# Patient Record
Sex: Male | Born: 1960 | Race: Asian | Hispanic: Yes | Marital: Married | Smoking: Never smoker
Health system: Southern US, Community
[De-identification: ages and names within clinical notes are randomized; demographics above are authoritative.]

---

## 2017-10-11 ENCOUNTER — Emergency Department (HOSPITAL_COMMUNITY): Payer: Self-pay

## 2017-10-11 ENCOUNTER — Encounter (HOSPITAL_COMMUNITY): Payer: Self-pay

## 2017-10-11 ENCOUNTER — Other Ambulatory Visit: Payer: Self-pay

## 2017-10-11 ENCOUNTER — Ambulatory Visit (HOSPITAL_COMMUNITY)
Admission: EM | Admit: 2017-10-11 | Discharge: 2017-10-11 | Disposition: A | Discharge status: Other Acute Inpatient Hospital | Payer: Self-pay

## 2017-10-11 ENCOUNTER — Emergency Department (HOSPITAL_COMMUNITY)
Admission: EM | Admit: 2017-10-11 | Discharge: 2017-10-11 | Disposition: A | Discharge status: Home / Self Care | Payer: Self-pay | Attending: Emergency Medicine | Admitting: Emergency Medicine

## 2017-10-11 DIAGNOSIS — S066X0A Traumatic subarachnoid hemorrhage without loss of consciousness, initial encounter: Secondary | ICD-10-CM | POA: Insufficient documentation

## 2017-10-11 DIAGNOSIS — I609 Nontraumatic subarachnoid hemorrhage, unspecified: Secondary | ICD-10-CM

## 2017-10-11 DIAGNOSIS — M549 Dorsalgia, unspecified: Secondary | ICD-10-CM | POA: Insufficient documentation

## 2017-10-11 DIAGNOSIS — I10 Essential (primary) hypertension: Secondary | ICD-10-CM | POA: Insufficient documentation

## 2017-10-11 DIAGNOSIS — Y9389 Activity, other specified: Secondary | ICD-10-CM | POA: Insufficient documentation

## 2017-10-11 DIAGNOSIS — Y929 Unspecified place or not applicable: Secondary | ICD-10-CM | POA: Insufficient documentation

## 2017-10-11 DIAGNOSIS — Y998 Other external cause status: Secondary | ICD-10-CM | POA: Insufficient documentation

## 2017-10-11 DIAGNOSIS — W1789XA Other fall from one level to another, initial encounter: Secondary | ICD-10-CM | POA: Insufficient documentation

## 2017-10-11 LAB — I-STAT CHEM 8, ED
BUN: 10 mg/dL (ref 6–20)
Calcium, Ion: 1.15 mmol/L (ref 1.15–1.40)
Chloride: 106 mmol/L (ref 101–111)
Creatinine, Ser: 1.1 mg/dL (ref 0.61–1.24)
Glucose, Bld: 114 mg/dL — ABNORMAL HIGH (ref 65–99)
HCT: 44 % (ref 39.0–52.0)
HEMOGLOBIN: 15 g/dL (ref 13.0–17.0)
POTASSIUM: 4 mmol/L (ref 3.5–5.1)
Sodium: 142 mmol/L (ref 135–145)
TCO2: 27 mmol/L (ref 22–32)

## 2017-10-11 LAB — COMPREHENSIVE METABOLIC PANEL
ALBUMIN: 4.4 g/dL (ref 3.5–5.0)
ALT: 26 U/L (ref 17–63)
AST: 31 U/L (ref 15–41)
Alkaline Phosphatase: 114 U/L (ref 38–126)
Anion gap: 11 (ref 5–15)
BUN: 8 mg/dL (ref 6–20)
CHLORIDE: 104 mmol/L (ref 101–111)
CO2: 23 mmol/L (ref 22–32)
CREATININE: 1.11 mg/dL (ref 0.61–1.24)
Calcium: 9.5 mg/dL (ref 8.9–10.3)
GFR calc non Af Amer: >60 mL/min (ref >60)
GLUCOSE: 111 mg/dL — AB (ref 65–99)
Potassium: 3.9 mmol/L (ref 3.5–5.1)
SODIUM: 138 mmol/L (ref 135–145)
Total Bilirubin: 0.3 mg/dL (ref 0.3–1.2)
Total Protein: 8.1 g/dL (ref 6.5–8.1)

## 2017-10-11 LAB — PROTIME-INR
INR: 0.95
Prothrombin Time: 12.6 seconds (ref 11.4–15.2)

## 2017-10-11 LAB — CBC
HCT: 42.8 % (ref 39.0–52.0)
Hemoglobin: 14.8 g/dL (ref 13.0–17.0)
MCH: 30.9 pg (ref 26.0–34.0)
MCHC: 34.6 g/dL (ref 30.0–36.0)
MCV: 89.4 fL (ref 78.0–100.0)
PLATELETS: 263 10*3/uL (ref 150–400)
RBC: 4.79 MIL/uL (ref 4.22–5.81)
RDW: 12.8 % (ref 11.5–15.5)
WBC: 13.7 10*3/uL — ABNORMAL HIGH (ref 4.0–10.5)

## 2017-10-11 LAB — ETHANOL: Alcohol, Ethyl (B): <10 mg/dL (ref <10)

## 2017-10-11 MED ORDER — LABETALOL HCL 5 MG/ML IV SOLN: 20.0000 mg | Freq: Once | INTRAVENOUS | Status: DC

## 2017-10-11 MED ORDER — TRAMADOL HCL 50 MG PO TABS: 50.0000 mg | ORAL_TABLET | Freq: Four times a day (QID) | ORAL | 0 refills | Status: DC | PRN

## 2017-10-11 MED ORDER — LABETALOL HCL 5 MG/ML IV SOLN
20.0000 mg | Freq: Once | INTRAVENOUS | Status: AC
  Administered 2017-10-11: 20 mg via INTRAVENOUS
  Filled 2017-10-11: qty 4

## 2017-10-11 MED ORDER — LISINOPRIL-HYDROCHLOROTHIAZIDE 10-12.5 MG PO TABS: 1.0000 | ORAL_TABLET | Freq: Every day | ORAL | 1 refills | Status: DC

## 2017-10-11 MED ORDER — FENTANYL CITRATE (PF) 100 MCG/2ML IJ SOLN
50.0000 ug | Freq: Once | INTRAMUSCULAR | Status: AC
  Administered 2017-10-11: 50 ug via INTRAVENOUS
  Filled 2017-10-11: qty 2

## 2017-10-11 MED ORDER — ONDANSETRON HCL 4 MG/2ML IJ SOLN
4.0000 mg | Freq: Once | INTRAMUSCULAR | Status: AC
  Administered 2017-10-11: 4 mg via INTRAVENOUS
  Filled 2017-10-11: qty 2

## 2017-10-11 NOTE — ED Provider Notes (Signed)
Patient placed in Quick Look pathway, seen and evaluated   Chief Complaint: fall  HPI:   Spanish speaking only male here for evaluation after fall while at work. Was standing on a 12 ft stilt painting when he fell backwards landing mostly on posterior head. Reports pain and swelling to top of head, right sided neck pain and low back pain since.   ROS: No LOC, vision changes, nausea, vomiting, amnesia to event, seizure like activity, anticoagulant use. No PMH states he has drank 2 cups of coffee, red bull and pepsi PTA.   Physical Exam:   Gen: No distress  Neuro: Awake and Alert  Skin: Warm    Focused Exam: Hypertensive 200/120s. Hematoma to coronal aspect of scalp, mildly tender w/o obvious crepitus or depression. R sided neck tenderness, no midline c-spine tenderness or step offs, pain with R neck bend and rotation. Mild bilateral lumbar paraspinal muscle tenderness, no midline CTL spine tenderness. No chest or abdominal tenderness. No pain with AP/L pelvis compression. Lungs CTAB. Ambulatory without pain. Strength 5/5 with hand grip and ankle F/E.  Sensation to light touch intact in hands and feet. Normal gait. No pronator drift. Normal finger-to-nose and finger tapping. CN I and VIII not tested. CN II-XII grossly intact bilaterally.   I recommended CT head/cervical spine and TL spine x-rays however pt is very concerned about cost of care in ED due to lack of insurance. He is inquiring about payment plans and is hesitant to stay for imaging. Discussed purpose of imaging and risk of leaving AMA. Ultimately agreed to CT head only. Will attempt to consult CM/SW to speak to patient. Initiation of care has begun. The patient has been counseled on the process, plan, and necessity for staying for the completion/evaluation, and the remainder of the medical screening examination    Liberty HandyGibbons, Ziair Penson J, Cordelia Poche-C 10/11/17 1921

## 2017-10-11 NOTE — ED Notes (Signed)
Pt daughter tearful at the idea of her father being admitted. Pt and family appear worried about cost of admission. Will continue to round and keep patient and family up to date on plan of care. This RN explained current POC to pt

## 2017-10-11 NOTE — ED Notes (Signed)
Patient transported to CT 

## 2017-10-11 NOTE — ED Notes (Signed)
EDP aware of BP. 

## 2017-10-11 NOTE — Care Management Note (Signed)
Case Management Note  Patient Details  Name: Joel CoganDimas Borman MRN: [id number] Date of Birth: May 28, 1961  Subjective/Objective:                  fall 6 feet/hit head  [facility] the [facility] and his daughter at the bedside. CM provided his [facility] with [facility] and encouraged her and the patient to call to schedule an appointment at discharge. The patient speaks some [place]. Daughter states he does not have a PCP or insurance. Provided with the telephone number for [employer].   Expected Discharge Date:                  Expected Discharge Plan:  Home/Self Care  In-House Referral:     Discharge planning Services  CM Consult, Indigent [facility] Clinic  Post Acute Care Choice:    Choice offered to:     DME Arranged:    DME Agency:     HH Arranged:    HH Agency:     Status of Service:  Completed, signed off  If discussed at [employer] Length of Stay Meetings, dates discussed:    Additional Comments:  Antony HasteBennett, Edker Punt Harris, RN 10/11/2017, 9:30 PM

## 2017-10-11 NOTE — ED Notes (Signed)
Neuro surgery at bedside.

## 2017-10-11 NOTE — Discharge Instructions (Signed)
Take Tylenol as needed for pain.  You can take the Ultram medication if you need something stronger.  Start taking her blood pressure medication every day.  It is important for you to follow-up with your primary care doctor to check up on your blood pressure

## 2017-10-11 NOTE — ED Notes (Signed)
ED Provider at bedside. 

## 2017-10-11 NOTE — ED Provider Notes (Signed)
[facility] EMERGENCY DEPARTMENT Provider Note   CSN: [id number] Arrival date & time: 10/11/17  1839     History   Chief Complaint Chief Complaint  Patient presents with  . Fall    HPI Joel Howard is a 57 y.o. male.  HPI Patient presented to the emergency room for evaluation of headache after a fall.  Patient works as a Education administrator.  He was on stilts when he fell backwards landing on his head.  He thinks he may have been 12 feet off the ground.  Patient has pain in the posterior part of his head and somewhat in his neck.  He complains of pain to me along his lower ribs but no pain on his spine.  He denies any abdominal pain.  No numbness or weakness.  He denies any loss of consciousness.  No vomiting.  No amnesia.  He does not use anticoagulants.  Patient was noted to be hypertensive in the emergency room. when I asked about his blood pressure the patient states he was told he had high blood pressure in the past he has not been on medications for that for a long period of time.  Denies any trouble with chest pain or shortness of breath. History reviewed. No pertinent past medical history.  There are no active problems to display for this patient.   History reviewed. No pertinent surgical history.     Home Medications    Prior to Admission medications   Medication Sig Start Date End Date Taking? Authorizing Provider  lisinopril-hydrochlorothiazide (PRINZIDE,ZESTORETIC) 10-12.5 MG tablet Take 1 tablet by mouth daily. 10/11/17   Linwood Dibbles, MD  traMADol (ULTRAM) 50 MG tablet Take 1 tablet (50 mg total) by mouth every 6 (six) hours as needed. 10/11/17   Linwood Dibbles, MD    Family History History reviewed. No pertinent family history.  Social History Social History   Tobacco Use  . Smoking status: Never Smoker  . Smokeless tobacco: Never Used  Substance Use Topics  . Alcohol use: No    Frequency: Never  . Drug use: Not on file     Allergies   Patient  has no known allergies.   Review of Systems Review of Systems  All other systems reviewed and are negative.    Physical Exam Updated Vital Signs BP (!) 152/98   Pulse 80   Temp 98.8 F (37.1 C) (Oral)   Resp 10   SpO2 96%   Physical Exam  Constitutional: He appears well-developed and well-nourished. No distress.  HENT:  Head: Normocephalic and atraumatic.  Right Ear: External ear normal.  Left Ear: External ear normal.  Eyes: Conjunctivae are normal. Right eye exhibits no discharge. Left eye exhibits no discharge. No scleral icterus.  Neck: Neck supple. No tracheal deviation present.  Cardiovascular: Normal rate, regular rhythm and intact distal pulses.  Pulmonary/Chest: Effort normal and breath sounds normal. No stridor. No respiratory distress. He has no wheezes. He has no rales.  Abdominal: Soft. Bowel sounds are normal. He exhibits no distension. There is no tenderness. There is no rebound and no guarding.  Musculoskeletal: He exhibits no edema or tenderness.  Neurological: He is alert. He has normal strength. No cranial nerve deficit (no facial droop, extraocular movements intact, no slurred speech) or sensory deficit. He exhibits normal muscle tone. He displays no seizure activity. Coordination normal.  Skin: Skin is warm and dry. No rash noted.  Psychiatric: He has a normal mood and affect.  Nursing note and  vitals reviewed.    ED Treatments / Results  Labs (all labs ordered are listed, but only abnormal results are displayed) Labs Reviewed  COMPREHENSIVE METABOLIC PANEL - Abnormal; Notable for the following components:      Result Value   Glucose, Bld 111 (*)    All other components within normal limits  CBC - Abnormal; Notable for the following components:   WBC 13.7 (*)    All other components within normal limits  I-STAT CHEM 8, ED - Abnormal; Notable for the following components:   Glucose, Bld 114 (*)    All other components within normal limits    ETHANOL  PROTIME-INR  URINALYSIS, ROUTINE W REFLEX MICROSCOPIC    EKG  EKG Interpretation  Date/Time:  Wednesday October 11 2017 21:29:06 EDT Ventricular Rate:  95 PR Interval:    QRS Duration: 101 QT Interval:  359 QTC Calculation: 452 R Axis:   65 Text Interpretation:  Sinus rhythm Borderline prolonged PR interval RSR' in V1 or V2, right VCD or RVH ST elev, probable normal early repol pattern limb lead reversal corrected Confirmed by Linwood Dibbles [phone]) on 10/11/2017 9:31:46 PM       Radiology Dg Chest 2 View  Result Date: 10/11/2017 CLINICAL DATA:  Fall from 12 feet EXAM: CHEST - 2 VIEW COMPARISON:  None. FINDINGS: The heart size and mediastinal contours are within normal limits. Both lungs are clear. The visualized skeletal structures are unremarkable. IMPRESSION: No active cardiopulmonary disease or rib fracture. Electronically Signed   By: Deatra Robinson M.D.   On: 10/11/2017 22:34   Ct Head Wo Contrast  Result Date: 10/11/2017 CLINICAL DATA:  Post fall, now with hematoma involving the posterior aspect of the head. EXAM: CT HEAD WITHOUT CONTRAST TECHNIQUE: Contiguous axial images were obtained from the base of the skull through the vertex without intravenous contrast. COMPARISON:  None. FINDINGS: Brain: There is a suspected tiny amount of subarachnoid hemorrhage about the left frontal sulci without associated mass effect (images 20 and 21, series 2; coronal images 16 and 17, series 4). No definite subdural hematoma. Gray-white differentiation is maintained. No CT evidence of acute large territory infarct. No intraparenchymal or extra-axial mass. Normal size and configuration of the ventricles and the basilar cisterns. No midline shift. No evidence of intraventricular hemorrhage. Vascular: No hyperdense vessel or unexpected calcification. Skull: No displaced calvarial fracture with special attention paid to the area of soft tissue swelling about the right parietoccipital calvarium.  Sinuses/Orbits: Limited visualization the paranasal sinuses and mastoid air cells is normal. No air-fluid levels. Other: There is an approximately 5.8 x 0.6 cm hematoma about the right parietoccipital calvarium. No associated displaced calvarial fracture. No radiopaque foreign body. IMPRESSION: Soft tissue swelling about the right parietoccipital calvarium with suspected tiny amount of subarachnoid hemorrhage within a left frontal sulcus. No displaced calvarial fracture radiopaque foreign body. Continued attention on follow-up is advised. Critical Value/emergent results were called by telephone at the time of interpretation on 10/11/2017 at 8:32 pm to Dr. Sharen Heck , who verbally acknowledged these results. Electronically Signed   By: Simonne Come M.D.   On: 10/11/2017 20:36   Ct Cervical Spine Wo Contrast  Result Date: 10/11/2017 CLINICAL DATA:  Fall backwards while at work today with subarachnoid hemorrhage in head CT. Evaluate for cervical spine fracture. EXAM: CT CERVICAL SPINE WITHOUT CONTRAST TECHNIQUE: Multidetector CT imaging of the cervical spine was performed without intravenous contrast. Multiplanar CT image reconstructions were also generated. COMPARISON:  None. FINDINGS: Alignment: Normal.  Skull base and vertebrae: No acute fracture. Vertebral body heights are maintained. The dens and skull base are intact. Soft tissues and spinal canal: No prevertebral fluid or swelling. No visible canal hematoma. Disc levels: Mild disc space narrowing and endplate spurring at C5-C6. Mild degenerative change at C1-C2. Upper chest: Possible nondisplaced posterior right apical pleuroparenchymal scarring. Right first rib fracture. Other: None. IMPRESSION: 1. No fracture or subluxation of the cervical spine. Mild degenerative change. 2. Possible nondisplaced posterior right first rib fracture. Electronically Signed   By: Rubye OaksMelanie  Ehinger M.D.   On: 10/11/2017 22:29    Procedures Procedures (including critical  care time)  Medications Ordered in ED Medications  labetalol (NORMODYNE,TRANDATE) injection 20 mg (0 mg Intravenous Hold 10/11/17 2246)  fentaNYL (SUBLIMAZE) injection 50 mcg (50 mcg Intravenous Given 10/11/17 2138)  ondansetron (ZOFRAN) injection 4 mg (4 mg Intravenous Given 10/11/17 2138)  labetalol (NORMODYNE,TRANDATE) injection 20 mg (20 mg Intravenous Given 10/11/17 2138)     Initial Impression / Assessment and Plan / ED Course  I have reviewed the triage vital signs and the nursing notes.  Pertinent labs & imaging results that were available during my care of the patient were reviewed by me and considered in my medical decision making (see chart for details).  Clinical Course as of Oct 12 2310  Wed Oct 11, 2017  2045 Subarachnoid hemorrhage noted on CT head per radiology. I have spoken with charge nurse Elliot GurneyWoody and triage RN and asked patient be brought into a room ASAP. He is hypertensive 200/120s as of 2 hours ago.   [CG]  2052 IMPRESSION: Soft tissue swelling about the right parietoccipital calvarium with suspected tiny amount of subarachnoid hemorrhage within a left frontal sulcus. No displaced calvarial fracture radiopaque foreign body. Continued attention on follow-up is advised.  Critical Value/emergent results were called by telephone at the time of interpretation on 10/11/2017 at 8:32 pm to Dr. Sharen HeckLAUDIA GIBBONS , who verbally acknowledged these results. CT Head Wo Contrast [CG]  2053 Neurosurgery paged   [CG]  [facility] [phone].  Briefly discussed results of CT scan and subarachnoid hemorrhage. Discussed plan to treat HTN, pain and consult neurosurgery.  [CG]  2128 Discussed case with Dr Lovell SheehanJenkins.  No intervention needed based on CT scan findings.  He will see the patient.  [JK]  2306 Labs reviewed.  No significant abnormalities  [JK]  2306 CT of C-spine suggest possible rib fracture although none noted on chest x-ray  [JK]    Clinical Course User Index [CG]  Liberty HandyGibbons, Claudia J, PA-C [JK] Linwood DibblesKnapp, Nereida Schepp, MD   Patient presented to the emergency room for evaluation after a fall at work.  CT scan demonstrated a small subarachnoid hemorrhage.  Patient was also noted to be very hypertensive.  He was treated with medications and this has improved.  Patient does have history of hypertension and has not been taking medication for a long period of time.  Patient's laboratory tests are otherwise reassuring.  No other serious injuries noted on his x-rays.  Patient is very concerned about the cost of being hospitalized and would prefer to go home.  Dr. Lovell SheehanJenkins evaluated the patient and from a neurosurgical standpoint he does not need to be admitted.  Patient's blood pressure has improved and I do not think he needs to be admitted for this either.  I do think it is important for him to follow-up with a primary care doctor.  I will give him a prescription for lisinopril hydrochlorothiazide.  I stressed the importance of this with the patient and his daughter   Final Clinical Impressions(s) / ED Diagnoses   Final diagnoses:  Back pain  Subarachnoid hemorrhage (HCC)  Uncontrolled hypertension    ED Discharge Orders        Ordered    traMADol (ULTRAM) 50 MG tablet  Every 6 hours PRN     10/11/17 2309    lisinopril-hydrochlorothiazide (PRINZIDE,ZESTORETIC) 10-12.5 MG tablet  Daily     10/11/17 2309       Linwood Dibbles, MD 10/11/17 2312

## 2017-10-11 NOTE — Consult Note (Signed)
Reason for Consult: Traumatic subarachnoid hemorrhage Referring Physician: Dr. Quentin OreKnapp  Joel Howard is an 57 y.o. male.  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  This was witnessed by another individual.  There is no reported loss of consciousness.  [facility] and the workup included a head CT which demonstrated a small traumatic subarachnoid hemorrhage.  A neurosurgical consultation was requested.  Presently the patient is alert and pleasant and accompanied by his daughter who speaks fluent [place] and [place].  He admits to soreness in his occipital region.  He has some mild neck pain.  He denies seizures, numbness, tingling, weakness, back pain, etc.  He denies taking any anticoagulants.  History reviewed. No pertinent past medical history.  History reviewed. No pertinent surgical history.  History reviewed. No pertinent family history.  Social History:  reports that  has never smoked. he has never used smokeless tobacco. He reports that he does not drink alcohol. His drug history is not on file.  Allergies: No Known Allergies  Medications:  I have reviewed the patient's current medications. Prior to Admission:  (Not in a hospital admission) Scheduled: . fentaNYL (SUBLIMAZE) injection  50 mcg Intravenous Once  . labetalol  20 mg Intravenous Once  . ondansetron (ZOFRAN) IV  4 mg Intravenous Once   Continuous:  PRN: Anti-infectives (From admission, onward)   None       No results found for this or any previous visit (from the past 48 hour(s)).  Ct Head Wo Contrast  Result Date: 10/11/2017 CLINICAL DATA:  Post fall, now with hematoma involving the posterior aspect of the head. EXAM: CT HEAD WITHOUT CONTRAST TECHNIQUE: Contiguous axial images were obtained from the base of the skull through the vertex without intravenous contrast.  COMPARISON:  None. FINDINGS: Brain: There is a suspected tiny amount of subarachnoid hemorrhage about the left frontal sulci without associated mass effect (images 20 and 21, series 2; coronal images 16 and 17, series 4). No definite subdural hematoma. Gray-white differentiation is maintained. No CT evidence of acute large territory infarct. No intraparenchymal or extra-axial mass. Normal size and configuration of the ventricles and the basilar cisterns. No midline shift. No evidence of intraventricular hemorrhage. Vascular: No hyperdense vessel or unexpected calcification. Skull: No displaced calvarial fracture with special attention paid to the area of soft tissue swelling about the right parietoccipital calvarium. Sinuses/Orbits: Limited visualization the paranasal sinuses and mastoid air cells is normal. No air-fluid levels. Other: There is an approximately 5.8 x 0.6 cm hematoma about the right parietoccipital calvarium. No associated displaced calvarial fracture. No radiopaque foreign body. IMPRESSION: Soft tissue swelling about the right parietoccipital calvarium with suspected tiny amount of subarachnoid hemorrhage within a left frontal sulcus. No displaced calvarial fracture radiopaque foreign body. Continued attention on follow-up is advised. Critical Value/emergent results were called by telephone at the time of interpretation on 10/11/2017 at 8:32 pm to Dr. Sharen HeckLAUDIA GIBBONS , who verbally acknowledged these results. Electronically Signed   By: Simonne ComeJohn  Watts M.D.   On: 10/11/2017 20:36    ROS: As above Blood pressure (!) 212/124, pulse (!) 110, temperature 98.8 F (37.1 C), temperature source Oral, resp. rate 18, SpO2 100 %. There is no height or weight on file to calculate BMI.  Physical Exam  General: An alert and pleasant small 57 year old Hispanic male in no apparent distress  HEENT: Normocephalic, he  has some soft tissue swelling in this occipital region on the right.  His pupils are equal.   Extraocular muscles are intact.  There is no evidence of CSF otorrhea rhinorrhea, battle sign, raccoon's eyes, hemotympanum, etc.  Neck: Supple without masses or deformities.  He has a normal range of motion.  Spurling's testing is negative.  Thorax: Symmetric  Abdomen: Soft  Back exam: Unremarkable  Extremities: Unremarkable  Neurologic exam the patient is alert and oriented x3.  Cranial nerves II through XII were examined bilaterally and grossly normal bilaterally.  Vision and hearing are grossly normal bilaterally.  His motor strength is 5/5 in spite of biceps, triceps, hand grip, quadriceps, gastrocnemius, and dorsiflexors.  Cerebellar function is intact to rapid alternating movements of the upper extremities bilaterally.  Sensory function is intact to light touch sensation in all distal dermatomes bilaterally.  Imaging studies: I reviewed the patient's head CT performed at [facility] today.  He has a small amount of traumatic subarachnoid hemorrhage along the convexity on the left.  There is no mass-effect.  He has soft tissue swelling in his scalp in the right occipital region.  Assessment/Plan: Traumatic subarachnoid hemorrhage: I have discussed the situation with the patient via his daughter.  He has a small amount of traumatic subarachnoid hemorrhage and does not take anticoagulants.  This should resolve without intervention.  From my point of view he can be discharged and follow-up as needed.  Please call if I can be of further assistance.  Cristi Loron 10/11/2017, 9:30 PM

## 2017-10-11 NOTE — ED Triage Notes (Signed)
Pt had a fall from stilts at work and hit his head. Pt denies LOC but small hematoma to the back of his head that he iced. Pt denies dizziness. Hypertensive in triage. Pt AOX4.

## 2017-10-11 NOTE — ED Notes (Signed)
EDP at bedside  

## 2018-10-24 IMAGING — CT CT HEAD W/O CM
3 series · 15 of 47 positions shown, 18 images · non-contrast
Comparison: None.

CLINICAL DATA: Post fall, now with hematoma involving the posterior
aspect of the head.

EXAM:
CT HEAD WITHOUT CONTRAST
TECHNIQUE: Contiguous axial images were obtained from the base of the skull
through the vertex without intravenous contrast.

[Series 2: head 5.0 h30s · axial · 0.43mm/px · z∈[+831,+961]mm · 9 of 32 slices shown, 12 images]
[im 3/32  brain]
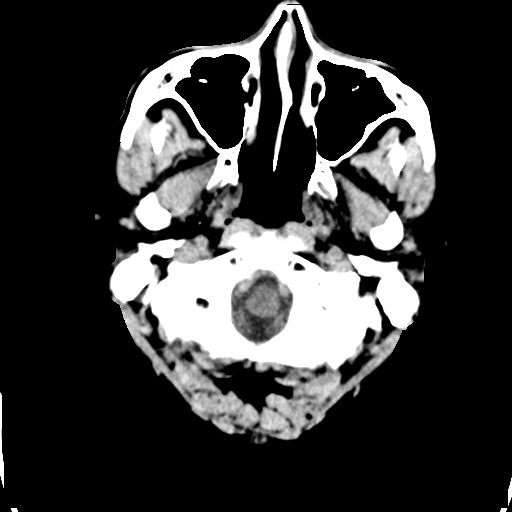
[im 3/32  bone]
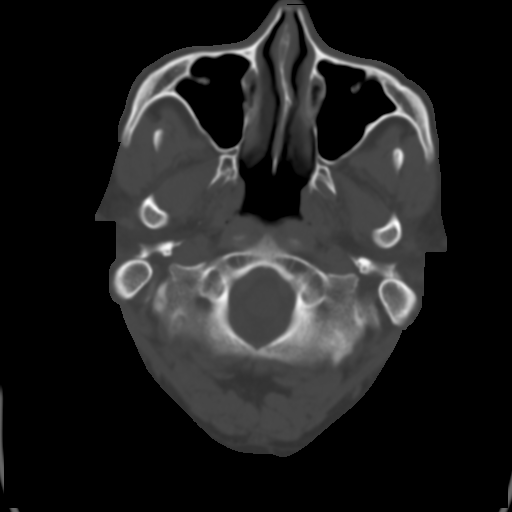
[im 6/32  brain]
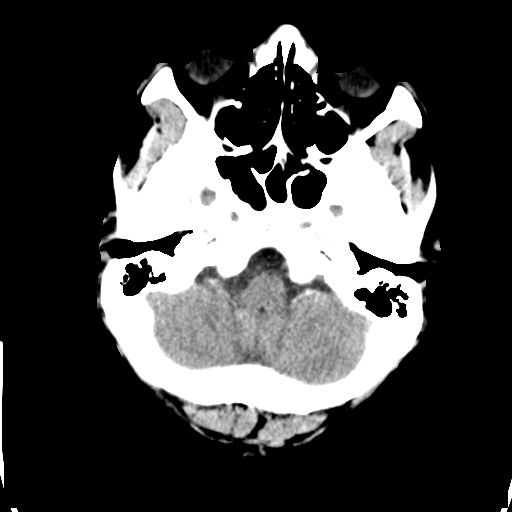
[im 9/32  brain]
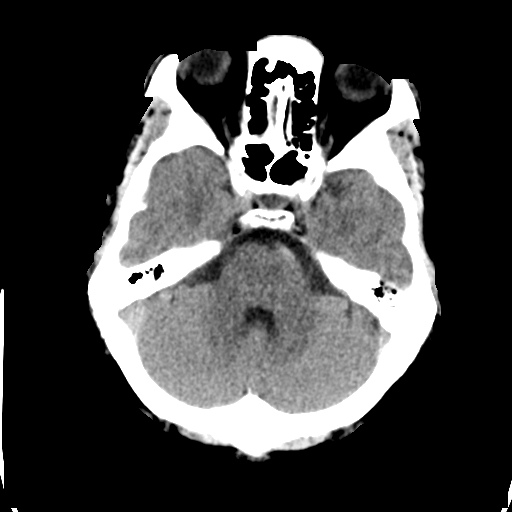
[im 12/32  brain]
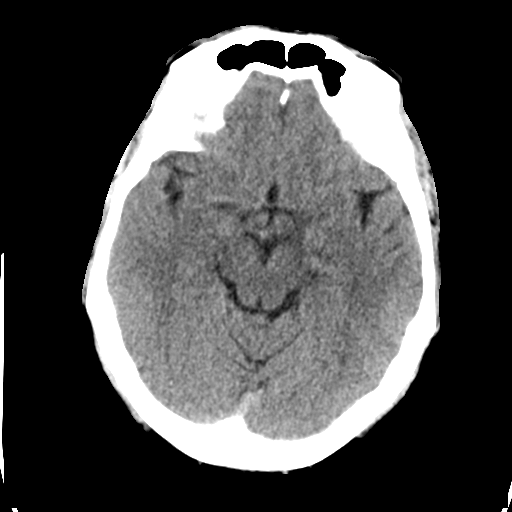
[im 17/32  brain]
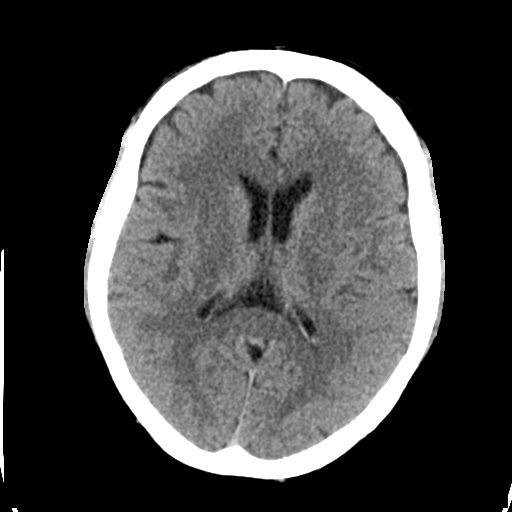
[im 17/32  bone]
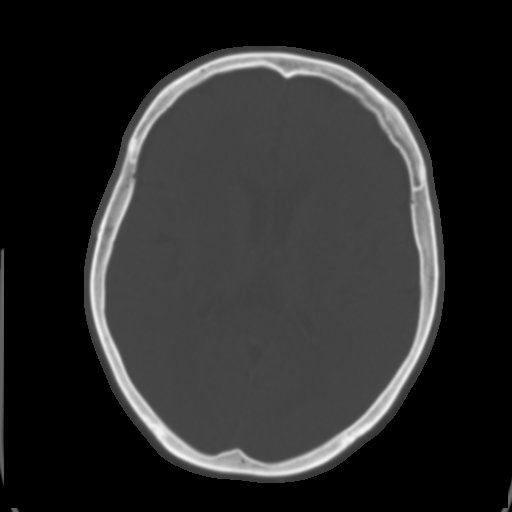
[im 20/32  brain]
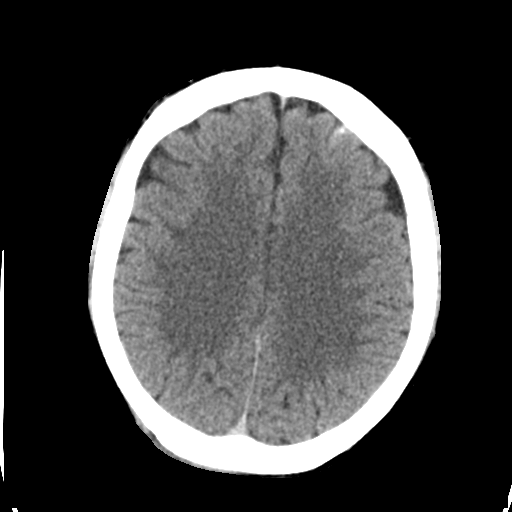
[im 23/32  brain]
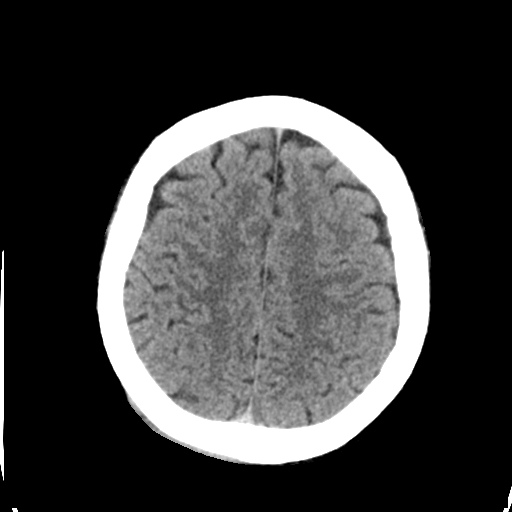
[im 26/32  brain]
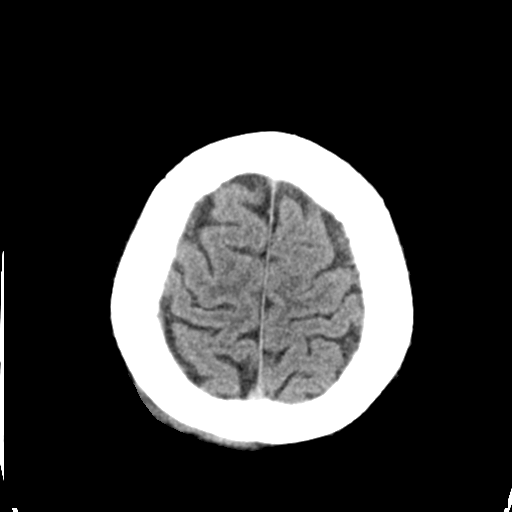
[im 29/32  brain]
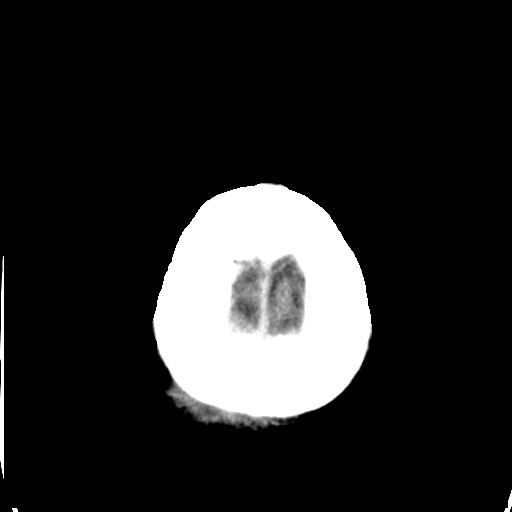
[im 29/32  bone]
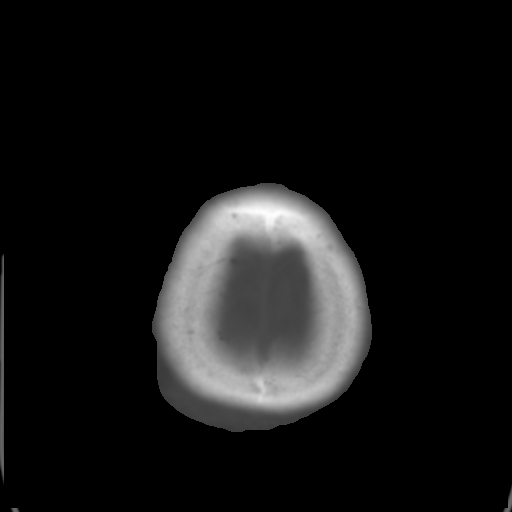

[Series 4: head 3.0 mpr cor · coronal · 0.31mm/px · 3 of 68 slices shown]
[im 23/68  brain]
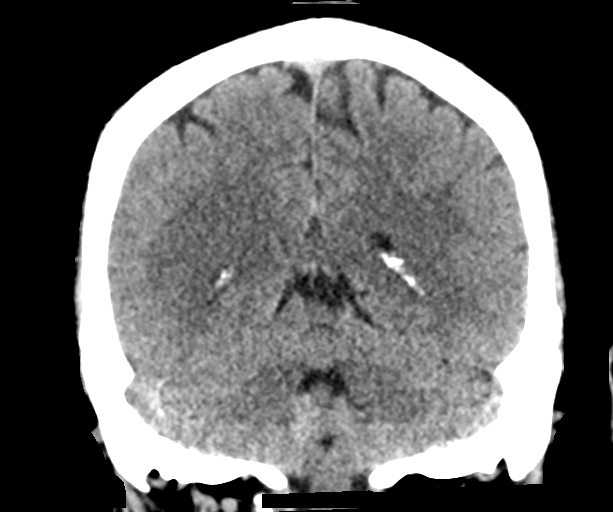
[im 30/68  brain]
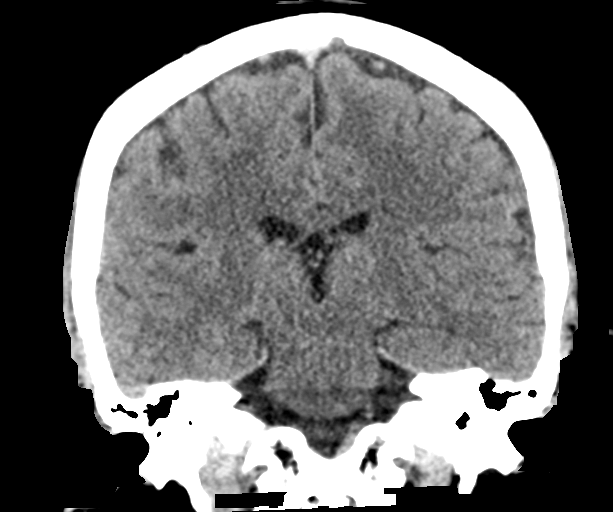
[im 38/68  brain]
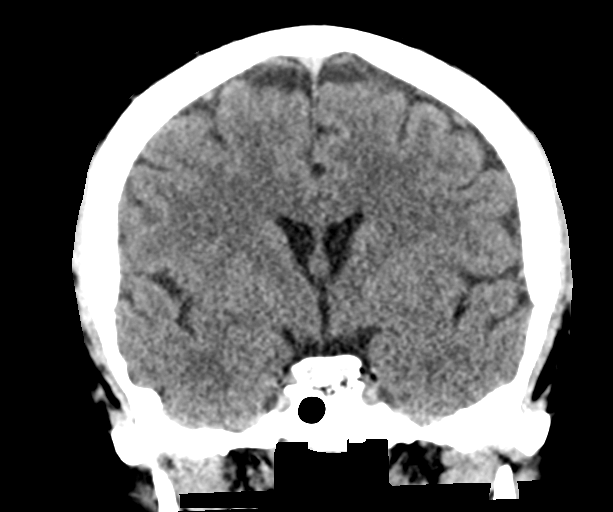

[Series 5: head 3.0 mpr sag · sagittal · 0.31mm/px · 3 of 60 slices shown]
[im 20/60  brain]
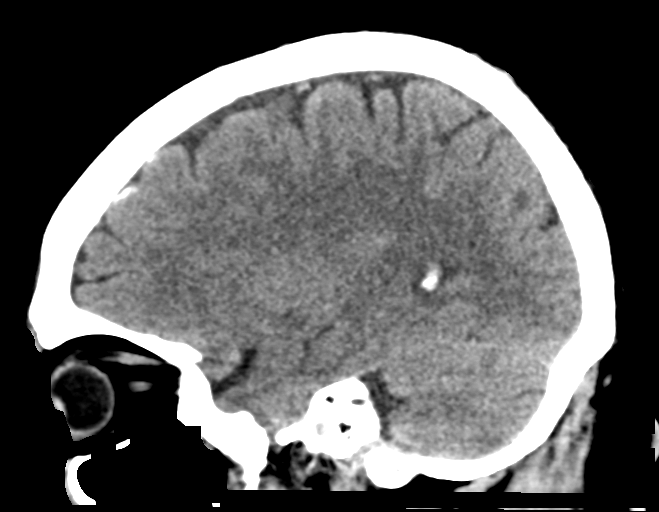
[im 30/60  brain]
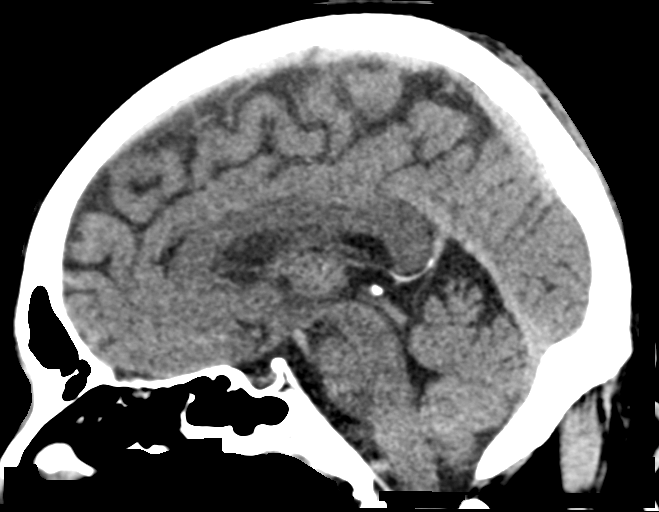
[im 40/60  brain]
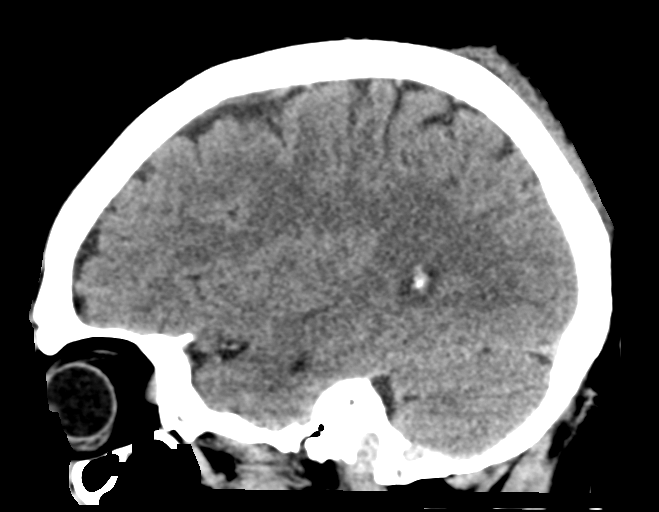

[15 of 47 positions shown; findings below may reference images not displayed]

FINDINGS: Brain: There is a suspected tiny amount of subarachnoid hemorrhage
about the left frontal sulci without associated mass effect (images
20 and 21, series 2; coronal images 16 and 17, series 4). No
definite subdural hematoma.

Gray-white differentiation is maintained. No CT evidence of acute
large territory infarct. No intraparenchymal or extra-axial mass.
Normal size and configuration of the ventricles and the basilar
cisterns. No midline shift. No evidence of intraventricular
hemorrhage.

Vascular: No hyperdense vessel or unexpected calcification.

Skull: No displaced calvarial fracture with special attention paid
to the area of soft tissue swelling about the right parietoccipital
calvarium.

Sinuses/Orbits: Limited visualization the paranasal sinuses and
mastoid air cells is normal. No air-fluid levels.

Other: There is an approximately 5.8 x 0.6 cm hematoma about the
right parietoccipital calvarium. No associated displaced calvarial
fracture. No radiopaque foreign body.
IMPRESSION: Soft tissue swelling about the right parietoccipital calvarium with
suspected tiny amount of subarachnoid hemorrhage within a left
frontal sulcus. No displaced calvarial fracture radiopaque foreign
body. Continued attention on follow-up is advised.

Critical Value/emergent results were called by telephone at the time
of interpretation on 10/11/2017 at [DATE] to Dr. YULMA NODA ,
who verbally acknowledged these results.

## 2020-02-20 ENCOUNTER — Ambulatory Visit (INDEPENDENT_AMBULATORY_CARE_PROVIDER_SITE_OTHER): Payer: Self-pay | Admitting: Primary Care

## 2020-02-20 ENCOUNTER — Other Ambulatory Visit: Payer: Self-pay

## 2020-02-20 ENCOUNTER — Encounter (INDEPENDENT_AMBULATORY_CARE_PROVIDER_SITE_OTHER): Payer: Self-pay | Admitting: Primary Care

## 2020-02-20 VITALS — BP 186/117 | HR 76 | Temp 98.2°F | Ht 67.5 in | Wt 171.8 lb

## 2020-02-20 DIAGNOSIS — Z114 Encounter for screening for human immunodeficiency virus [HIV]: Secondary | ICD-10-CM

## 2020-02-20 DIAGNOSIS — Z23 Encounter for immunization: Secondary | ICD-10-CM

## 2020-02-20 DIAGNOSIS — I10 Essential (primary) hypertension: Secondary | ICD-10-CM

## 2020-02-20 DIAGNOSIS — Z13220 Encounter for screening for lipoid disorders: Secondary | ICD-10-CM

## 2020-02-20 DIAGNOSIS — Z1159 Encounter for screening for other viral diseases: Secondary | ICD-10-CM

## 2020-02-20 DIAGNOSIS — Z7689 Persons encountering health services in other specified circumstances: Secondary | ICD-10-CM

## 2020-02-20 MED ORDER — AMLODIPINE BESYLATE 10 MG PO TABS: 10.0000 mg | ORAL_TABLET | Freq: Every day | ORAL | 3 refills | Status: AC

## 2020-02-20 MED ORDER — LISINOPRIL-HYDROCHLOROTHIAZIDE 20-25 MG PO TABS: 1.0000 | ORAL_TABLET | Freq: Every day | ORAL | 3 refills | Status: AC

## 2020-02-20 MED FILL — AMLODIPINE BESYLATE 10 MG T: 10 | 30 days supply | Qty: 30 | Fill #0

## 2020-02-20 MED FILL — LISINOPRIL-HYDROCHLOROTHIAZ: 20-25 | 30 days supply | Qty: 30 | Fill #0

## 2020-02-20 NOTE — Progress Notes (Signed)
States heart rate/palpitations are too hard

## 2020-02-20 NOTE — Patient Instructions (Addendum)
Hipertensin en los adultos Hypertension, Adult El trmino hipertensin es otra forma de denominar a la presin arterial elevada. La presin arterial elevada fuerza al corazn a trabajar ms para bombear la sangre. Esto puede causar problemas con el paso del tiempo. Una lectura de presin arterial est compuesta por 2 nmeros. Hay un nmero superior (sistlico) sobre un nmero inferior (diastlico). Lo ideal es tener la presin arterial por debajo de 120/80. Las elecciones saludables pueden ayudar a bajar la presin arterial, o tal vez necesite medicamentos para bajarla. Cules son las causas? Se desconoce la causa de esta afeccin. Algunas afecciones pueden estar relacionadas con la presin arterial alta. Qu incrementa el riesgo?  Fumar.  Tener diabetes mellitus tipo 2, colesterol alto, o ambos.  No hacer la cantidad suficiente de actividad fsica o ejercicio.  Tener sobrepeso.  Consumir mucha grasa, azcar, caloras o sal (sodio) en su dieta.  Beber alcohol en exceso.  Tener una enfermedad renal a largo plazo (crnica).  Tener antecedentes familiares de presin arterial alta.  Edad. Los riesgos aumentan con la edad.  Raza. El riesgo es mayor para las personas afroamericanas.  Sexo. Antes de los 45aos, los hombres corren ms riesgo que las mujeres. Despus de los 65aos, las mujeres corren ms riesgo que los hombres.  Tener apnea obstructiva del sueo.  Estrs. Cules son los signos o los sntomas?  Es posible que la presin arterial alta puede no cause sntomas. La presin arterial muy alta (crisis hipertensiva) puede provocar: ? Dolor de cabeza. ? Sensaciones de preocupacin o nerviosismo (ansiedad). ? Falta de aire. ? Hemorragia nasal. ? Sensacin de malestar en el estmago (nuseas). ? Vmitos. ? Cambios en la forma de ver. ? Dolor muy intenso en el pecho. ? Convulsiones. Cmo se trata?  Esta afeccin se trata haciendo cambios saludables en el estilo de  vida, por ejemplo: ? Consumir alimentos saludables. ? Hacer ms ejercicio. ? Beber menos alcohol.  El mdico puede recetarle medicamentos si los cambios en el estilo de vida no son suficientes para lograr controlar la presin arterial y si: ? El nmero de arriba est por encima de 130. ? El nmero de abajo est por encima de 80.  Su presin arterial personal ideal puede variar. Siga estas instrucciones en su casa: Comida y bebida   Si se lo dicen, siga el plan de alimentacin de DASH (Dietary Approaches to Stop Hypertension, Maneras de alimentarse para detener la hipertensin). Para seguir este plan: ? Llene la mitad del plato de cada comida con frutas y verduras. ? Llene un cuarto del plato de cada comida con cereales integrales. Los cereales integrales incluyen pasta integral, arroz integral y pan integral. ? Coma y beba productos lcteos con bajo contenido de grasa, como leche descremada o yogur bajo en grasas. ? Llene un cuarto del plato de cada comida con protenas bajas en grasa (magras). Las protenas bajas en grasa incluyen pescado, pollo sin piel, huevos, frijoles y tofu. ? Evite consumir carne grasa, carne curada y procesada, o pollo con piel. ? Evite consumir alimentos prehechos o procesados.  Consuma menos de 1500 mg de sal por da.  No beba alcohol si: ? El mdico le indica que no lo haga. ? Est embarazada, puede estar embarazada o est tratando de quedar embarazada.  Si bebe alcohol: ? Limite la cantidad que bebe a lo siguiente:  De 0 a 1 medida por da para las mujeres.  De 0 a 2 medidas por da para los hombres. ? Est atento a   la cantidad de alcohol que hay en las bebidas que toma. En los [place], una medida equivale a una botella de cerveza de 12oz ( ), un vaso de vino de 5oz ( ) o un vaso de una bebida alcohlica de alta graduacin de 1oz (36ml). Estilo de vida   Trabaje con su mdico para mantenerse en un peso saludable o para perder  peso. Pregntele a su mdico cul es el peso recomendable para usted.  Haga al menos de ejercicio la [employer] de la [place]. Estos pueden incluir caminar, nadar o andar en bicicleta.  Realice al menos 30 minutos de ejercicio que fortalezca sus msculos (ejercicios de resistencia) al menos 3 das a la [place]. Estos pueden incluir levantar pesas o hacer Pilates.  No consuma ningn producto que contenga nicotina o tabaco, como cigarrillos, cigarrillos electrnicos y tabaco de Theatre manager. Si necesita ayuda para dejar de fumar, consulte al [employer].  Controle su presin arterial en su casa tal como le indic el mdico.  Concurra a todas las visitas de seguimiento como se lo haya indicado el mdico. Esto es importante. Medicamentos  [employer] de venta libre y los recetados solamente como se lo haya indicado el mdico. Siga cuidadosamente las indicaciones.  No omita las dosis de medicamentos para la presin arterial. Los medicamentos pierden eficacia si omite dosis. El hecho de omitir las dosis tambin [place] el riesgo de otros problemas.  Pregntele a su mdico a qu efectos secundarios o reacciones a los Museum/gallery curator. Comunquese con un mdico si:  Piensa que tiene [place] reaccin a los medicamentos que est tomando.  Tiene dolores de cabeza frecuentes (recurrentes).  Se siente mareado.  Tiene hinchazn en los tobillos.  Tiene problemas de visin. Solicite ayuda inmediatamente si:  Siente un dolor de cabeza muy intenso.  Empieza a sentirse desorientado (confundido).  Se siente dbil o adormecido.  Siente que va a desmayarse.  Tiene un dolor muy intenso en las siguientes zonas: ? Pecho. ? Vientre (abdomen).  Vomita ms de una vez.  Tiene dificultad para respirar. Resumen  El trmino hipertensin es otra forma de denominar a la presin arterial elevada.  La presin arterial elevada fuerza al corazn a trabajar ms para bombear  la sangre.  Para la [employer], una presin arterial normal es menor que 120/80.  Las decisiones saludables pueden ayudarle a disminuir su presin arterial. Si no puede bajar su presin arterial mediante decisiones saludables, es posible que deba tomar medicamentos. Esta informacin no tiene Theme park manager el consejo del mdico. Asegrese de hacerle al mdico cualquier pregunta que tenga. Document Revised: 04/26/2018 Document Reviewed: 04/26/2018 Elsevier Patient Education  2020 [employer]. https://www.cdc.gov/vaccines/hcp/vis/vis-statements/tdap.pdf">  Madilyn Fireman Tdap (ttanos, difteria y tos [place]): lo que debe saber Tdap (Tetanus, Diphtheria, Pertussis) Vaccine: What You Need to Know 1. Por qu vacunarse? La vacuna Tdap puede prevenir el ttanos, la difteria y la tos [place]. La difteria y la tos Benetta Spar se [place] de persona a Social worker. El ttanos ingresa al organismo a travs de cortes o heridas.  El [place] (T) provoca rigidez dolorosa en los msculos. El ttanos puede causar graves problemas de [place], como no poder abrir la boca, Warehouse manager dificultad para tragar y Industrial/product designer, o la muerte.  La DIFTERIA (D) puede causar dificultad para respirar, insuficiencia cardaca, parlisis o muerte.  La TOS FERINA (aP) tambin conocida como "tos convulsa" puede causar tos violenta e incontrolable lo que hace difcil respirar, comer o beber. La tos [employer]  puede ser muy grave en los bebs y en los nios pequeos, y causar neumona, convulsiones, dao cerebral o la [place]. En adolescentes y [address], puede causar prdida de [place], prdida del control de la vejiga, [place] y fracturas de Forensic psychologist al toser de Wellsite geologist intensa. 2. Madilyn Fireman Tdap La vacuna Tdap es solo para nios de 7 aos en adelante, adolescentes y [place].  Los adolescentes debe recibir una dosis nica de la vacuna Tdap, preferentemente a los 11 o 12 aos. Las mujeres embarazadas deben recibir una dosis de la vacuna Tdap en cada  embarazo para proteger al recin nacido de la tos [place]. Los bebs tienen mayor riesgo de sufrir complicaciones graves y potencialmente mortales debido a la tos [place]. Los adultos que nunca recibieron la vacuna Tdap deben recibir una dosis. [place], los adultos deben recibir una dosis de refuerzo cada 10 aos, o antes si la persona sufre una [place] o una herida grave y [place]. Las dosis de refuerzo pueden ser de la vacuna Tdap o la Td (una vacuna diferente que protege contra el ttanos y la difteria, pero no contra la tos [place]). La vacuna Tdap puede ser administrada al mismo tiempo que otras vacunas. 3. Hable con el mdico Comunquese con la persona que le coloca las vacunas si la persona que la recibe:  Ha tenido una reaccin alrgica despus de Neomia Dear dosis anterior de cualquier vacuna contra el ttanos, la difteria o la tos [place], o cualquier alergia grave, potencialmente mortal.  Ha tenido un coma, disminucin del nivel de la conciencia o convulsiones prolongadas dentro de los [address] posteriores a una dosis anterior de cualquier vacuna contra la tos ferina (DTP, DTaP o Tdap).  Tiene convulsiones u otro problema del sistema nervioso.  Alguna vez tuvo sndrome de Guillain-Barr (tambin llamado SGB).  Ha tenido dolor intenso o hinchazn despus de una dosis anterior de cualquier vacuna contra el ttanos o la difteria. En algunos casos, es posible que el mdico decida posponer la aplicacin de la vacuna Tdap para una visita en el futuro.  Las personas que sufren trastornos menores, como un resfro, pueden vacunarse. Las personas que tienen enfermedades moderadas o graves generalmente deben esperar hasta recuperarse para poder recibir la vacuna Tdap.  Su mdico puede darle ms informacin. 4. Riesgos de [place] reaccin a la vacuna  Despus de recibir la vacuna Tdap a veces se puede Surveyor, mining, enrojecimiento o Paramedic donde se aplic la inyeccin, fiebre leve, dolor de cabeza,  sensacin de cansancio y nuseas, vmitos, diarrea o dolor de Tool. Las personas a veces se desmayan despus de procedimientos mdicos, incluida la vacunacin. Informe al mdico si se siente mareado, tiene cambios en la visin o zumbidos en los odos.  Al igual que con cualquier [employer] Data, existe una probabilidad muy remota de que una vacuna cause una reaccin alrgica grave, otra lesin grave o la muerte. 5. Qu pasa si se presenta un problema grave? Podra producirse una reaccin alrgica despus de que la persona vacunada abandone la clnica. Si observa signos de Runner, broadcasting/film/video grave (ronchas, hinchazn de la cara y la garganta, dificultad para respirar, latidos cardacos acelerados, mareos o debilidad), llame al 9-1-1 y lleve a la persona al hospital ms cercano. Si se presentan otros signos que le preocupan, comunquese con su mdico.  Las reacciones adversas deben informarse al Sistema de Informe de Eventos Adversos de Administrator, arts (Vaccine Adverse Event Reporting System, VAERS). Por lo general, el mdico presenta este informe o puede hacerlo usted mismo.  Visite el sitio web del VAERS en www.vaers.LAgents.no o llame al 564-242-3022. El VAERS es solo para Biomedical engineer; su personal no proporciona asesoramiento mdico. 6. Programa Nacional de Compensacin de Daos por [employer] El SunTrust de Compensacin de Daos por Administrator, arts (National Vaccine Injury [employer], Cabin crew) es un programa federal que fue creado para Patent examiner a las personas que puedan haber sufrido daos al recibir ciertas vacunas. Visite el sitio web del VICP en SpiritualWord.at o llame al 1-939-318-6139 para obtener ms informacin acerca del programa y de cmo presentar un reclamo. Hay un lmite de tiempo para presentar un reclamo de compensacin. 7. Cmo puedo obtener ms informacin?  Pregntele a su mdico.  Comunquese con el servicio de salud de su localidad o su  estado.  Comunquese con Building control surveyor for [employer] and Prevention, CDC (Centros para el Control y la Prevencin de [place]): ? Llame al [phone] (1-800-CDC-INFO) o ? Visite el sitio Environmental manager en PicCapture.uy Declaracin de informacin de la vacuna Tdap (ttanos, difteria y [place]) (07/28/2018) Esta informacin no tiene Theme park manager el consejo del mdico. Asegrese de hacerle al mdico cualquier pregunta que tenga. Document Revised: 11/20/2018 Document Reviewed: 11/20/2018 Elsevier Patient Education  2020 [employer].

## 2020-02-20 NOTE — Progress Notes (Signed)
New Patient Office Visit  Subjective:  Patient ID: Joel Howard, male    DOB: 26-Nov-1960  Age: 59 y.o. MRN: [id number]  CC:  Chief Complaint  Patient presents with  . New Patient (Initial Visit)    hypertension     HPI [name] is a 59 year old Hispanic male (Spanish speaking Joel Howard [phone] interpreter) presents for establishment of care and management of hypertension.  Denies shortness of breath, headaches, chest pain or lower extremity edema.  No past medical history on file.  No past surgical history on file.  No family history on file.  Social History   Socioeconomic History  . Marital status: Married    Spouse name: Not on file  . Number of children: Not on file  . Years of education: Not on file  . Highest education level: Not on file  Occupational History  . Not on file  Tobacco Use  . Smoking status: Never Smoker  . Smokeless tobacco: Never Used  Substance and Sexual Activity  . Alcohol use: No  . Drug use: Not on file  . Sexual activity: Not on file  Other Topics Concern  . Not on file  Social History Narrative  . Not on file   Social Determinants of Health   Financial Resource Strain:   . Difficulty of Paying Living Expenses:   Food Insecurity:   . Worried About Charity fundraiser in the Last Year:   . Arboriculturist in the Last Year:   Transportation Needs:   . Film/video editor (Medical):   Marland Kitchen Lack of Transportation (Non-Medical):   Physical Activity:   . Days of Exercise per Week:   . Minutes of Exercise per Session:   Stress:   . Feeling of Stress :   Social Connections:   . Frequency of Communication with Friends and Family:   . Frequency of Social Gatherings with Friends and Family:   . Attends Religious Services:   . Active Member of Clubs or Organizations:   . Attends Archivist Meetings:   Marland Kitchen Marital Status:   Intimate Partner Violence:   . Fear of Current or Ex-Partner:   . Emotionally Abused:   Marland Kitchen  Physically Abused:   . Sexually Abused:     ROS Review of Systems  All other systems reviewed and are negative.   Objective:   Today's Vitals: BP (!) 186/117 (BP Location: Right Arm, Patient Position: Sitting, Cuff Size: Normal)   Pulse 76   Temp 98.2 F (36.8 C) (Oral)   Ht 5' 7.5" (1.715 m)   Wt 171 lb 12.8 oz (77.9 kg)   SpO2 97%   BMI 26.51 kg/m   Physical Exam Vitals reviewed.  Constitutional:      Appearance: Normal appearance.  HENT:     Head: Normocephalic.     Right Ear: Tympanic membrane normal.     Left Ear: Tympanic membrane normal.     Nose: Nose normal.  Eyes:     Extraocular Movements: Extraocular movements intact.     Pupils: Pupils are equal, round, and reactive to light.  Cardiovascular:     Rate and Rhythm: Normal rate and regular rhythm.     Pulses: Normal pulses.     Heart sounds: Normal heart sounds.  Pulmonary:     Effort: Pulmonary effort is normal.     Breath sounds: Normal breath sounds.  Abdominal:     General: Bowel sounds are normal.  Musculoskeletal:  General: Normal range of motion.     Cervical back: Normal range of motion and neck supple.  Skin:    General: Skin is warm and dry.  Neurological:     Mental Status: He is alert and oriented to person, place, and time.  Psychiatric:        Mood and Affect: Mood normal.        Behavior: Behavior normal.        Thought Content: Thought content normal.        Judgment: Judgment normal.     Assessment & Plan:  Joel Howard was seen today for new patient (initial visit).  Diagnoses and all orders for this visit: Joel Howard was seen today for new patient (initial visit).  Diagnoses and all orders for this visit:  Encounter to establish care Joel Mire, NP-C will be your  (PCP) she is mastered prepared . She is skilled to diagnosed and treat illness. Also able to answer health concern as well as continuing care of varied medical conditions, not limited by cause, organ system, or  diagnosis.   -     CBC with Differential/Platelet; Future  Essential hypertension Counseled on blood pressure goal of less than 130/80, low-sodium, DASH diet, medication compliance, 150 minutes of moderate intensity exercise per week. Discussed medication compliance, adverse effects.  lisinopril-hydrochlorothiazide (ZESTORETIC) 20-25 MG tablet; Take 1 tablet by mouth daily. -     amLODipine (NORVASC) 10 MG tablet; Take 1 tablet (10 mg total) by mouth daily.  -     CMP14+EGFR; Future  Need for Tdap vaccination Refuse will think about it for the next visit -     Tdap vaccine greater than or equal to 7yo IM  Screening for HIV (human immunodeficiency virus) -     HIV Antibody (routine testing w rflx); Future  Encounter for hepatitis C screening test for low risk patient -     Hepatitis C Antibody; Future  Encounter for screening for lipid disorder -     Lipid panel; Future  Other orders -     lisinopril-hydrochlorothiazide (ZESTORETIC) 20-25 MG tablet; Take 1 tablet by mouth daily. -     amLODipine (NORVASC) 10 MG tablet; Take 1 tablet (10 mg total) by mouth daily.      Outpatient Encounter Medications as of 02/20/2020  Medication Sig  . amLODipine (NORVASC) 10 MG tablet Take 1 tablet (10 mg total) by mouth daily.  Marland Kitchen lisinopril-hydrochlorothiazide (ZESTORETIC) 20-25 MG tablet Take 1 tablet by mouth daily.  . [DISCONTINUED] lisinopril-hydrochlorothiazide (PRINZIDE,ZESTORETIC) 10-12.5 MG tablet Take 1 tablet by mouth daily. (Patient not taking: Reported on 02/20/2020)  . [DISCONTINUED] traMADol (ULTRAM) 50 MG tablet Take 1 tablet (50 mg total) by mouth every 6 (six) hours as needed.   No facility-administered encounter medications on file as of 02/20/2020.    Follow-up: Return in about 5 years (around 02/19/2025) for Bp in person.   Joel Perna, NP

## 2020-03-26 ENCOUNTER — Encounter (INDEPENDENT_AMBULATORY_CARE_PROVIDER_SITE_OTHER): Payer: Self-pay | Admitting: Primary Care

## 2020-03-26 ENCOUNTER — Other Ambulatory Visit: Payer: Self-pay

## 2020-03-26 ENCOUNTER — Ambulatory Visit (INDEPENDENT_AMBULATORY_CARE_PROVIDER_SITE_OTHER): Payer: Self-pay | Admitting: Primary Care

## 2020-03-26 ENCOUNTER — Encounter (INDEPENDENT_AMBULATORY_CARE_PROVIDER_SITE_OTHER): Payer: Self-pay

## 2020-03-26 VITALS — BP 167/100 | HR 68 | Temp 97.9°F | Resp 16 | Wt 167.0 lb

## 2020-03-26 DIAGNOSIS — I10 Essential (primary) hypertension: Secondary | ICD-10-CM

## 2020-03-26 NOTE — Progress Notes (Signed)
Here for HTN check   Pt did not take meds because of the cramping and hiccups lasting for 10 days after taking them

## 2020-03-26 NOTE — Progress Notes (Signed)
Established Patient Office Visit  Subjective:  Patient ID: Joel Howard, male    DOB: 1961-05-29  Age: 59 y.o. MRN: [id number]  CC: No chief complaint on file.   HPI Mr. Joel Howard is a 59 year old Hispanic male who speaks Spanish only - interpretor [phone] Vernona Rieger    presents for management of HTN. Patient has not taken Bp  because of the cramping and hiccups . States took Lisinopril/HCTZ 20/25 daily before and did not have this.   History reviewed. No pertinent past medical history.  History reviewed. No pertinent surgical history.  History reviewed. No pertinent family history.  Social History   Socioeconomic History  . Marital status: Married    Spouse name: Not on file  . Number of children: Not on file  . Years of education: Not on file  . Highest education level: Not on file  Occupational History  . Not on file  Tobacco Use  . Smoking status: Never Smoker  . Smokeless tobacco: Never Used  Substance and Sexual Activity  . Alcohol use: No  . Drug use: Not on file  . Sexual activity: Not on file  Other Topics Concern  . Not on file  Social History Narrative  . Not on file   Social Determinants of Health   Financial Resource Strain:   . Difficulty of Paying Living Expenses: Not on file  Food Insecurity:   . Worried About Programme researcher, broadcasting/film/video in the Last Year: Not on file  . Ran Out of Food in the Last Year: Not on file  Transportation Needs:   . Lack of Transportation (Medical): Not on file  . Lack of Transportation (Non-Medical): Not on file  Physical Activity:   . Days of Exercise per Week: Not on file  . Minutes of Exercise per Session: Not on file  Stress:   . Feeling of Stress : Not on file  Social Connections:   . Frequency of Communication with Friends and Family: Not on file  . Frequency of Social Gatherings with Friends and Family: Not on file  . Attends Religious Services: Not on file  . Active Member of Clubs or Organizations: Not on file   . Attends Banker Meetings: Not on file  . Marital Status: Not on file  Intimate Partner Violence:   . Fear of Current or Ex-Partner: Not on file  . Emotionally Abused: Not on file  . Physically Abused: Not on file  . Sexually Abused: Not on file    Outpatient Medications Prior to Visit  Medication Sig Dispense Refill  . amLODipine (NORVASC) 10 MG tablet Take 1 tablet (10 mg total) by mouth daily. 90 tablet 3  . lisinopril-hydrochlorothiazide (ZESTORETIC) 20-25 MG tablet Take 1 tablet by mouth daily. 90 tablet 3   No facility-administered medications prior to visit.    No Known Allergies  ROS Review of Systems  All other systems reviewed and are negative.     Objective:    Physical Exam Vitals reviewed.  HENT:     Head: Normocephalic.     Right Ear: Tympanic membrane normal.     Left Ear: Tympanic membrane normal.  Eyes:     Extraocular Movements: Extraocular movements intact.  Cardiovascular:     Rate and Rhythm: Normal rate and regular rhythm.     Pulses: Normal pulses.     Heart sounds: Normal heart sounds.  Pulmonary:     Effort: Pulmonary effort is normal.     Breath  sounds: Normal breath sounds.  Abdominal:     General: Bowel sounds are normal.  Skin:    General: Skin is warm and dry.  Neurological:     Mental Status: He is alert and oriented to person, place, and time.  Psychiatric:        Mood and Affect: Mood normal.        Behavior: Behavior normal.        Thought Content: Thought content normal.        Judgment: Judgment normal.     BP (!) 167/100 (Cuff Size: Normal)   Pulse 68   Temp 97.9 F (36.6 C)   Resp 16   Wt 167 lb (75.8 kg)   SpO2 100%   BMI 25.77 kg/m  Wt Readings from Last 3 Encounters:  03/26/20 167 lb (75.8 kg)  02/20/20 171 lb 12.8 oz (77.9 kg)     Health Maintenance Due  Topic Date Due  . Hepatitis C Screening  Never done  . COVID-19 Vaccine (1) Never done  . HIV Screening  Never done  . COLONOSCOPY   Never done  . INFLUENZA VACCINE  Never done    There are no preventive care reminders to display for this patient.  No results found for: TSH Lab Results  Component Value Date   WBC 13.7 (H) 10/11/2017   HGB 15.0 10/11/2017   HCT 44.0 10/11/2017   MCV 89.4 10/11/2017   PLT 263 10/11/2017   Lab Results  Component Value Date   NA 142 10/11/2017   K 4.0 10/11/2017   CO2 23 10/11/2017   GLUCOSE 114 (H) 10/11/2017   BUN 10 10/11/2017   CREATININE 1.10 10/11/2017   BILITOT 0.3 10/11/2017   ALKPHOS 114 10/11/2017   AST 31 10/11/2017   ALT 26 10/11/2017   PROT 8.1 10/11/2017   ALBUMIN 4.4 10/11/2017   CALCIUM 9.5 10/11/2017   ANIONGAP 11 10/11/2017   No results found for: CHOL No results found for: HDL No results found for: LDLCALC No results found for: TRIG No results found for: CHOLHDL No results found for: SAYT0Z    Assessment & Plan:  Diagnoses and all orders for this visit:  HTN, goal below 130/80 Counseled on blood pressure goal of less than 130/80, unmet low-sodium, DASH diet, medication compliance, 150 minutes of moderate intensity exercise per week. Not taking medication consistently - discussed risk of stroke and heart attack. F/U appt BP expected to be improved Discussed medication compliance, adverse effects.   No orders of the defined types were placed in this encounter.   Follow-up: Return for Patient to call with Bp reading .    Grayce Sessions, NP

## 2020-03-26 NOTE — Patient Instructions (Signed)
Hipertensión en los adultos °Hypertension, Adult °El término hipertensión es otra forma de denominar a la presión arterial elevada. La presión arterial elevada fuerza al corazón a trabajar más para bombear la sangre. Esto puede causar problemas con el paso del tiempo. °Una lectura de presión arterial está compuesta por 2 números. Hay un número superior (sistólico) sobre un número inferior (diastólico). Lo ideal es tener la presión arterial por debajo de 120/80. Las elecciones saludables pueden ayudar a bajar la presión arterial, o tal vez necesite medicamentos para bajarla. °¿Cuáles son las causas? °Se desconoce la causa de esta afección. Algunas afecciones pueden estar relacionadas con la presión arterial alta. °¿Qué incrementa el riesgo? °· Fumar. °· Tener diabetes mellitus tipo 2, colesterol alto, o ambos. °· No hacer la cantidad suficiente de actividad física o ejercicio. °· Tener sobrepeso. °· Consumir mucha grasa, azúcar, calorías o sal (sodio) en su dieta. °· Beber alcohol en exceso. °· Tener una enfermedad renal a largo plazo (crónica). °· Tener antecedentes familiares de presión arterial alta. °· Edad. Los riesgos aumentan con la edad. °· Raza. El riesgo es mayor para las personas afroamericanas. °· Sexo. Antes de los 45 años, los hombres corren más riesgo que las mujeres. Después de los 65 años, las mujeres corren más riesgo que los hombres. °· Tener apnea obstructiva del sueño. °· Estrés. °¿Cuáles son los signos o los síntomas? °· Es posible que la presión arterial alta puede no cause síntomas. La presión arterial muy alta (crisis hipertensiva) puede provocar: °? Dolor de cabeza. °? Sensaciones de preocupación o nerviosismo (ansiedad). °? Falta de aire. °? Hemorragia nasal. °? Sensación de malestar en el estómago (náuseas). °? Vómitos. °? Cambios en la forma de ver. °? Dolor muy intenso en el pecho. °? Convulsiones. °¿Cómo se trata? °· Esta afección se trata haciendo cambios saludables en el estilo de  vida, por ejemplo: °? Consumir alimentos saludables. °? Hacer más ejercicio. °? Beber menos alcohol. °· El médico puede recetarle medicamentos si los cambios en el estilo de vida no son suficientes para lograr controlar la presión arterial y si: °? El número de arriba está por encima de 130. °? El número de abajo está por encima de 80. °· Su presión arterial personal ideal puede variar. °Siga estas instrucciones en su casa: °Comida y bebida ° °· Si se lo dicen, siga el plan de alimentación de DASH (Dietary Approaches to Stop Hypertension, Maneras de alimentarse para detener la hipertensión). Para seguir este plan: °? Llene la mitad del plato de cada comida con frutas y verduras. °? Llene un cuarto del plato de cada comida con cereales integrales. Los cereales integrales incluyen pasta integral, arroz integral y pan integral. °? Coma y beba productos lácteos con bajo contenido de grasa, como leche descremada o yogur bajo en grasas. °? Llene un cuarto del plato de cada comida con proteínas bajas en grasa (magras). Las proteínas bajas en grasa incluyen pescado, pollo sin piel, huevos, frijoles y tofu. °? Evite consumir carne grasa, carne curada y procesada, o pollo con piel. °? Evite consumir alimentos prehechos o procesados. °· Consuma menos de 1500 mg de sal por día. °· No beba alcohol si: °? El médico le indica que no lo haga. °? Está embarazada, puede estar embarazada o está tratando de quedar embarazada. °· Si bebe alcohol: °? Limite la cantidad que bebe a lo siguiente: °§ De 0 a 1 medida por día para las mujeres. °§ De 0 a 2 medidas por día para los hombres. °? Esté atento a   la cantidad de alcohol que hay en las bebidas que toma. En los [place], una medida equivale a una botella de cerveza de 12 oz (355 ml), un vaso de vino de 5 oz (148 ml) o un vaso de una bebida alcohólica de alta graduación de 1½ oz (44 ml). °Estilo de vida ° °· Trabaje con su médico para mantenerse en un peso saludable o para perder  peso. Pregúntele a su médico cuál es el peso recomendable para usted. °· Haga al menos 30 minutos de ejercicio la mayoría de los días de la semana. Estos pueden incluir caminar, nadar o andar en bicicleta. °· Realice al menos 30 minutos de ejercicio que fortalezca sus músculos (ejercicios de resistencia) al menos 3 días a la semana. Estos pueden incluir levantar pesas o hacer [place]. °· No consuma ningún producto que contenga nicotina o tabaco, como cigarrillos, cigarrillos electrónicos y tabaco de mascar. Si necesita ayuda para dejar de fumar, consulte al médico. °· Controle su presión arterial en su casa tal como le indicó el médico. °· Concurra a todas las visitas de seguimiento como se lo haya indicado el médico. Esto es importante. °Medicamentos °· Tome los medicamentos de venta libre y los recetados solamente como se lo haya indicado el médico. Siga cuidadosamente las indicaciones. °· No omita las dosis de medicamentos para la presión arterial. Los medicamentos pierden eficacia si omite dosis. El hecho de omitir las dosis también aumenta el riesgo de otros problemas. °· Pregúntele a su médico a qué efectos secundarios o reacciones a los medicamentos debe prestar atención. °Comuníquese con un médico si: °· Piensa que tiene una reacción a los medicamentos que está tomando. °· Tiene dolores de cabeza frecuentes (recurrentes). °· Se siente mareado. °· Tiene hinchazón en los tobillos. °· Tiene problemas de visión. °Solicite ayuda inmediatamente si: °· Siente un dolor de cabeza muy intenso. °· Empieza a sentirse desorientado (confundido). °· Se siente débil o adormecido. °· Siente que va a desmayarse. °· Tiene un dolor muy intenso en las siguientes zonas: °? Pecho. °? Vientre (abdomen). °· Vomita más de una vez. °· Tiene dificultad para respirar. °Resumen °· El término hipertensión es otra forma de denominar a la presión arterial elevada. °· La presión arterial elevada fuerza al corazón a trabajar más para bombear  la sangre. °· Para la mayoría de las personas, una presión arterial normal es menor que 120/80. °· Las decisiones saludables pueden ayudarle a disminuir su presión arterial. Si no puede bajar su presión arterial mediante decisiones saludables, es posible que deba tomar medicamentos. °Esta información no tiene como fin reemplazar el consejo del médico. Asegúrese de hacerle al médico cualquier pregunta que tenga. °Document Revised: 04/26/2018 Document Reviewed: 04/26/2018 °[employer] Patient Education © 2020 [employer] ° °
